# Patient Record
Sex: Male | Born: 1960 | Race: White | Hispanic: No | Marital: Married | Smoking: Never smoker
Health system: Southern US, Community
[De-identification: ages and names within clinical notes are randomized; demographics above are authoritative.]

## PROBLEM LIST (undated history)

## (undated) DIAGNOSIS — T7840XA Allergy, unspecified, initial encounter: Secondary | ICD-10-CM

## (undated) DIAGNOSIS — K219 Gastro-esophageal reflux disease without esophagitis: Secondary | ICD-10-CM

## (undated) DIAGNOSIS — M1990 Unspecified osteoarthritis, unspecified site: Secondary | ICD-10-CM

## (undated) DIAGNOSIS — N200 Calculus of kidney: Secondary | ICD-10-CM

## (undated) HISTORY — DX: Allergy, unspecified, initial encounter: T78.40XA

## (undated) HISTORY — DX: Calculus of kidney: N20.0

## (undated) HISTORY — DX: Unspecified osteoarthritis, unspecified site: M19.90

## (undated) HISTORY — PX: TONSILLECTOMY: SUR1361

## (undated) HISTORY — DX: Gastro-esophageal reflux disease without esophagitis: K21.9

---

## 1996-04-12 HISTORY — PX: LAMINOTOMY: SHX998

## 1997-04-12 HISTORY — PX: COLONOSCOPY: SHX174

## 2009-04-12 HISTORY — PX: UPPER GASTROINTESTINAL ENDOSCOPY: SHX188

## 2012-10-05 ENCOUNTER — Encounter: Payer: Self-pay | Admitting: *Deleted

## 2012-10-23 ENCOUNTER — Ambulatory Visit (INDEPENDENT_AMBULATORY_CARE_PROVIDER_SITE_OTHER): Payer: PRIVATE HEALTH INSURANCE | Admitting: General Surgery

## 2012-10-23 ENCOUNTER — Encounter: Payer: Self-pay | Admitting: General Surgery

## 2012-10-23 VITALS — BP 110/60 | HR 68 | Resp 12 | Ht 72.0 in | Wt 174.0 lb

## 2012-10-23 DIAGNOSIS — K4020 Bilateral inguinal hernia, without obstruction or gangrene, not specified as recurrent: Secondary | ICD-10-CM

## 2012-10-23 DIAGNOSIS — K4090 Unilateral inguinal hernia, without obstruction or gangrene, not specified as recurrent: Secondary | ICD-10-CM | POA: Insufficient documentation

## 2012-10-23 NOTE — Patient Instructions (Addendum)
Return if symptoms worsen or do not improve.

## 2012-10-23 NOTE — Progress Notes (Signed)
Patient ID: Andre Lara, male   DOB: Nov 30, 1960, 52 y.o.   MRN: [id number]  Chief Complaint  Patient presents with  . Other    evaluation of inguinal hernia    HPI Andre Lara is a 52 y.o. male here for evaluation of bilateral inguinal hernias. This occurred as a result of moving furniture about 1 month ago. The patient reports discomfort in the left groin area that has been ongoing. He states that he had some discomfort in the right groin for 1 week. He does report being able to feel an elongated structure on the left side. He states he is currently having a dull ache in the left groin. Patient has no previous history of hernias.  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 During the move he noticed left-sided groin discomfort as well as a thrombosed hemorrhoid, the latter which has significantly improved. The patient has had less groin discomfort over the last 7-10 days, but had been informed at the time of his rectal problem exam that hernias were present. The patient has not had any difficulty with constipation, nor has he appreciated any urinary difficulties. HPI  History reviewed. No pertinent past medical history.  Past Surgical History  Procedure Laterality Date  . Laminotomy      Issues with recovering from anesthesia  . Colonoscopy  1999  . Upper gastrointestinal endoscopy  2011    No family history on file.  Social History History  Substance Use Topics  . Smoking status: Never Smoker   . Smokeless tobacco: Never Used  . Alcohol Use: Yes    No Known Allergies  Current Outpatient Prescriptions  Medication Sig Dispense Refill  . cetirizine (ZYRTEC) 10 MG tablet Take 10 mg by mouth daily.       No current facility-administered medications for this visit.    Review of Systems Review of Systems  Constitutional: Negative.   Respiratory: Negative.   Cardiovascular: Negative.      Blood pressure 110/60, pulse 68, resp. rate 12, height 6' (1.829 m), weight 174 lb (78.926 kg).  Physical Exam Physical Exam  Constitutional: He is oriented to person, place, and time. He appears well-developed and well-nourished.  Eyes: Conjunctivae are normal. No scleral icterus.  Neck: Neck supple.  Cardiovascular: Normal rate, regular rhythm and normal heart sounds.   Pulmonary/Chest: Effort normal and breath sounds normal.  Abdominal: A hernia is present. Hernia confirmed positive in the right inguinal area and confirmed positive in the left inguinal area.  Lymphadenopathy:    He has no cervical adenopathy.  Neurological: He is alert and oriented to person, place, and time.    Data Reviewed No data for review.  Assessment    Small bilateral inguinal hernias, left modestly greater than right, minimally symptomatic.     Plan    Small hernias are evident, and is difficult from his clinical history to tell whether his groin pain was secondary to the acute development or hernias, or more likely that he became aware of them with muscle strain during the strenuous activity involved removing. He is noticed a significant improvement over the last week or so.  Should he continue to show improvement, he may continue physical activity as desired and was encouraged report should hernias become symptomatic. If he doesn't continue to improve, and appreciates ongoing discomfort in the groin areas, he's been encouraged to call for assessment and to discuss elective  hernia repair.        Andre Lara 10/23/2012, 9:15 PM

## 2012-11-08 ENCOUNTER — Ambulatory Visit: Payer: Self-pay | Admitting: Internal Medicine

## 2013-08-10 ENCOUNTER — Emergency Department (INDEPENDENT_AMBULATORY_CARE_PROVIDER_SITE_OTHER): Payer: PRIVATE HEALTH INSURANCE

## 2013-08-10 ENCOUNTER — Encounter (HOSPITAL_COMMUNITY): Payer: Self-pay | Admitting: Emergency Medicine

## 2013-08-10 ENCOUNTER — Emergency Department (HOSPITAL_COMMUNITY)
Admission: EM | Admit: 2013-08-10 | Discharge: 2013-08-10 | Disposition: A | Discharge status: Home / Self Care | Payer: PRIVATE HEALTH INSURANCE | Source: Home / Self Care | Attending: Family Medicine | Admitting: Family Medicine

## 2013-08-10 DIAGNOSIS — R509 Fever, unspecified: Secondary | ICD-10-CM

## 2013-08-10 MED ORDER — DOXYCYCLINE HYCLATE 100 MG PO CAPS: 100.0000 mg | ORAL_CAPSULE | Freq: Two times a day (BID) | ORAL | Status: DC

## 2013-08-10 MED ORDER — AMOXICILLIN 500 MG PO CAPS: 1000.0000 mg | ORAL_CAPSULE | Freq: Three times a day (TID) | ORAL | Status: DC

## 2013-08-10 NOTE — ED Notes (Signed)
Pt c/o cold sx onset 5 days Sx include cough, congested, BA, chills, palpitations Dr. Georgina Snell is in the room w/pt Alert w/no signs of acute distress.

## 2013-08-10 NOTE — Discharge Instructions (Signed)
Thank you for coming in today. I am not sure where the fever is coming from.  We are treating for both pneumonia and tick born illness.  Take the two antibiotics as directed.  Call or go to the emergency room if you get worse, have trouble breathing, have chest pains, or palpitations.   Fever, Adult A fever is a higher than normal body temperature. In an adult, an oral temperature around 98.6 F (37 C) is considered normal. A temperature of 100.4 F (38 C) or higher is generally considered a fever. Mild or moderate fevers generally have no long-term effects and often do not require treatment. Extreme fever (greater than or equal to 106 F or 41.1 C) can cause seizures. The sweating that may occur with repeated or prolonged fever may cause dehydration. Elderly people can develop confusion during a fever. A measured temperature can vary with:  Age.  Time of day.  Method of measurement (mouth, underarm, rectal, or ear). The fever is confirmed by taking a temperature with a thermometer. Temperatures can be taken different ways. Some methods are accurate and some are not.  An oral temperature is used most commonly. Electronic thermometers are fast and accurate.  An ear temperature will only be accurate if the thermometer is positioned as recommended by the manufacturer.  A rectal temperature is accurate and done for those adults who have a condition where an oral temperature cannot be taken.  An underarm (axillary) temperature is not accurate and not recommended. Fever is a symptom, not a disease.  CAUSES   Infections commonly cause fever.  Some noninfectious causes for fever include:  Some arthritis conditions.  Some thyroid or adrenal gland conditions.  Some immune system conditions.  Some types of cancer.  A medicine reaction.  High doses of certain street drugs such as methamphetamine.  Dehydration.  Exposure to high outside or room temperatures.  Occasionally, the  source of a fever cannot be determined. This is sometimes called a "fever of unknown origin" (FUO).  Some situations may lead to a temporary rise in body temperature that may go away on its own. Examples are:  Childbirth.  Surgery.  Intense exercise. HOME CARE INSTRUCTIONS   Take appropriate medicines for fever. Follow dosing instructions carefully. If you use acetaminophen to reduce the fever, be careful to avoid taking other medicines that also contain acetaminophen. Do not take aspirin for a fever if you are younger than age 53. There is an association with Reye's syndrome. Reye's syndrome is a rare but potentially deadly disease.  If an infection is present and antibiotics have been prescribed, take them as directed. Finish them even if you start to feel better.  Rest as needed.  Maintain an adequate fluid intake. To prevent dehydration during an illness with prolonged or recurrent fever, you may need to drink extra fluid.Drink enough fluids to keep your urine clear or pale yellow.  Sponging or bathing with room temperature water may help reduce body temperature. Do not use ice water or alcohol sponge baths.  Dress comfortably, but do not over-bundle. SEEK MEDICAL CARE IF:   You are unable to keep fluids down.  You develop vomiting or diarrhea.  You are not feeling at least partly better after 3 days.  You develop new symptoms or problems. SEEK IMMEDIATE MEDICAL CARE IF:   You have shortness of breath or trouble breathing.  You develop excessive weakness.  You are dizzy or you faint.  You are extremely thirsty or you are  making little or no urine.  You develop new pain that was not there before (such as in the head, neck, chest, back, or abdomen).  You have persistant vomiting and diarrhea for more than 1 to 2 days.  You develop a stiff neck or your eyes become sensitive to light.  You develop a skin rash.  You have a fever or persistent symptoms for more than 2  to 3 days.  You have a fever and your symptoms suddenly get worse. MAKE SURE YOU:   Understand these instructions.  Will watch your condition.  Will get help right away if you are not doing well or get worse. Document Released: 09/22/2000 Document Revised: 06/21/2011 Document Reviewed: 01/28/2011 [facility] Patient Information 2014 [place], [place].

## 2013-08-10 NOTE — ED Provider Notes (Signed)
Andre Lara is a 53 y.o. male who presents to Urgent Care today for fever body aches chills coughing congestion. Patient has had mild symptoms for 5 days and worsening symptoms 2 days ago. He notes significant chills and records occurred 2 days ago. He denies any chest pains or shortness of breath. He notes a moderately productive cough. He's tried over-the-counter oral antihistamines which have helped some.    History reviewed. No pertinent past medical history. History  Substance Use Topics  . Smoking status: Never Smoker   . Smokeless tobacco: Never Used  . Alcohol Use: Yes   ROS as above Medications: No current facility-administered medications for this encounter.   Current Outpatient Prescriptions  Medication Sig Dispense Refill  . amoxicillin (AMOXIL) 500 MG capsule Take 2 capsules (1,000 mg total) by mouth 3 (three) times daily.  42 capsule  0  . cetirizine (ZYRTEC) 10 MG tablet Take 10 mg by mouth daily.      Marland Kitchen doxycycline (VIBRAMYCIN) 100 MG capsule Take 1 capsule (100 mg total) by mouth 2 (two) times daily.  14 capsule  0    Exam:  BP 105/57  Pulse 80  Temp(Src) 102.3 F (39.1 C) (Oral)  Resp 18  SpO2 99% Gen: Well NAD HEENT: EOMI,  MMM normal tympanic membranes and posterior pharynx. Mildly inflamed nasal turbinates. Lungs: Normal work of breathing. CTABL Heart: RRR no MRG Abd: NABS, Soft. NT, ND Exts: Brisk capillary refill, warm and well perfused.  Skin: No significant rash   No results found for this or any previous visit (from the past 24 hour(s)). Dg Chest 2 View  08/10/2013   CLINICAL DATA:  Chills fever cough flu like symptoms  EXAM: CHEST  2 VIEW  COMPARISON:  None.  FINDINGS: The heart size and mediastinal contours are within normal limits. Both lungs are clear. The visualized skeletal structures are unremarkable.  IMPRESSION: No active cardiopulmonary disease.   Electronically Signed   By: Skipper Cliche M.D.   On: 08/10/2013 10:54    Assessment and  Plan: 53 y.o. male with fever. Unclear etiology. Likely pneumonia versus tick bite. Influenza is also a possibility. Plan to treat with high-dose amoxicillin and doxycycline for coverage of strep pneumo, atypical pneumonia, and tickborne illness. Tylenol and ibuprofen for symptom management. Will followup with primary care provider.  Discussed warning signs or symptoms. Please see discharge instructions. Patient expresses understanding.    Gregor Hams, MD 08/10/13 1210

## 2013-09-10 ENCOUNTER — Ambulatory Visit (INDEPENDENT_AMBULATORY_CARE_PROVIDER_SITE_OTHER): Payer: PRIVATE HEALTH INSURANCE | Admitting: Internal Medicine

## 2013-09-10 DIAGNOSIS — Z7184 Encounter for health counseling related to travel: Secondary | ICD-10-CM | POA: Insufficient documentation

## 2013-09-10 DIAGNOSIS — Z23 Encounter for immunization: Secondary | ICD-10-CM

## 2013-09-10 DIAGNOSIS — Z7189 Other specified counseling: Secondary | ICD-10-CM

## 2013-09-10 MED ORDER — CIPROFLOXACIN HCL 500 MG PO TABS: 500.0000 mg | ORAL_TABLET | Freq: Two times a day (BID) | ORAL | Status: DC

## 2013-09-10 MED ORDER — CHLOROQUINE PHOSPHATE 500 MG PO TABS
500.0000 mg | ORAL_TABLET | ORAL | Status: DC
Start: 2013-09-10 — End: 2013-11-29

## 2013-09-10 MED ORDER — TYPHOID VACCINE PO CPDR: 1.0000 | DELAYED_RELEASE_CAPSULE | ORAL | Status: DC

## 2013-09-10 NOTE — Progress Notes (Signed)
  Subjective:    JAMESON TORMEY is a 53 y.o. male who presents to the Infectious Disease clinic for travel consultation. Planned departure date: July 10th          Planned return date: 10 days Countries of travel: [place] Areas in country: rural   Accommodations: rural Purpose of travel: [place] [employer] work Prior travel out of [place]: yes Currently ill / Fever: no History of liver or kidney disease: no  Data Review:  no medical issues   Review of Systems n/a    Objective:    n/a    Assessment:    No contraindications to travel. none      Plan:    Issues discussed: environmental concerns, future shots, insect-borne illnesses, malaria, motion sickness, MVA safety, rabies, safe food/water, traveler's diarrhea, website/handouts for more information, what to do if ill upon return and what to do if ill while there. Immunizations recommended: Td. Malaria prophylaxis: chloroquine, weekly dose starting 1 week before entering endemic area, ending 4 weeks after leaving area Traveler's diarrhea prophylaxis: ciprofloxacin. Total duration of visit: 1 Hour. Total time spent on education, counseling, coordination of care: 30 Minutes.

## 2013-10-17 ENCOUNTER — Encounter: Payer: Self-pay | Admitting: Internal Medicine

## 2013-10-17 ENCOUNTER — Ambulatory Visit (INDEPENDENT_AMBULATORY_CARE_PROVIDER_SITE_OTHER): Payer: PRIVATE HEALTH INSURANCE | Admitting: Internal Medicine

## 2013-10-17 VITALS — BP 97/59 | HR 66 | Temp 97.8°F | Ht 73.1 in | Wt 174.0 lb

## 2013-10-17 DIAGNOSIS — K4090 Unilateral inguinal hernia, without obstruction or gangrene, not specified as recurrent: Secondary | ICD-10-CM

## 2013-10-17 DIAGNOSIS — Z0000 Encounter for general adult medical examination without abnormal findings: Secondary | ICD-10-CM

## 2013-10-17 NOTE — Assessment & Plan Note (Addendum)
Td 2015 [place] for diarrhea, no polyps; + FH  colon polyps, recommend a colonoscopy. Labs Labs including a A1c (patient reports he is intolerant to high sugar diet and likes to be checked for diabetes) Diet-exercise discussed

## 2013-10-17 NOTE — Progress Notes (Signed)
Pre visit review using our clinic review tool, if applicable. No additional management support is needed unless otherwise documented below in the visit note. 

## 2013-10-17 NOTE — Assessment & Plan Note (Signed)
Still has occasional discomfort on the left, exam confirms the presence off a left inguinal hernia, exam negative on the right. Plan: Recommend to see surgery

## 2013-10-17 NOTE — Patient Instructions (Signed)
Get your blood work before you leave   Next visit is for a physical exam in 1 year, mfasting Please make an appointment

## 2013-10-17 NOTE — Progress Notes (Signed)
Subjective:    Patient ID: UNKNOWN SCHLEYER, male    DOB: [birth year], 53 y.o.   MRN: [id number]  DOS:  10/17/2013 Type of visit - description: new pt, CPX History: In general feeling well, reports he has a long history of intolerance to high doses of sugar b/c  sugar goes  high and then " I crash". Currently eating healthy and having no issues   ROS No  CP, SOB  Denies  nausea, vomiting diarrhea, blood in the stools (-) cough, sputum production (-) wheezing, chest congestion No dysuria, gross hematuria, difficulty urinating  No testicular pain-swelling  No anxiety, depression Denies fatigue      History reviewed. No pertinent past medical history.  Past Surgical History  Procedure Laterality Date  . Laminotomy  1998    Issues with recovering from anesthesia  . Colonoscopy  1999    normal (done for diarrhea)  . Upper gastrointestinal endoscopy  2011    History   Social History  . Marital Status: Married    Spouse Name: N/A    Number of Children: 0  . Years of Education: N/A   Occupational History  . minister     Social History Main Topics  . Smoking status: Never Smoker   . Smokeless tobacco: Never Used  . Alcohol Use: No     Comment: very rare   . Drug Use: No  . Sexual Activity: Not on file   Other Topics Concern  . Not on file   Social History Narrative   Moved to [place] , from [place]   Family History  Problem Relation Age of Onset  . Hemochromatosis Mother   . Liver cancer Mother   . Diabetes Neg Hx   . CAD Neg Hx   . Colon cancer Other     GF  . Prostate cancer Father     dx at age ~ 86  . Colon polyps Father         Medication List       This list is accurate as of: 10/17/13 11:59 PM.  Always use your most recent med list.               cetirizine 10 MG tablet  Commonly known as:  ZYRTEC  Take 10 mg by mouth daily.     chloroquine 500 MG tablet  Commonly known as:  ARALEN  Take 1 tablet (500 mg total) by mouth once a  week. Begin 2 weeks prior to travel and complete 4 weeks after travel.     typhoid DR capsule  Commonly known as:  VIVOTIF  Take 1 capsule by mouth every other day. Keep refrigerated           Objective:   Physical Exam  Genitourinary:      BP 97/59  Pulse 66  Temp(Src) 97.8 F (36.6 C)  Ht 6' 1.1" (1.857 m)  Wt 174 lb (78.926 kg)  BMI 22.89 kg/m2  SpO2 98%  General -- alert, well-developed, NAD.  Neck --no thyromegaly , normal carotid pulse  HEENT-- Not pale.  Lungs -- normal respiratory effort, no intercostal retractions, no accessory muscle use, and normal breath sounds.  Heart-- normal rate, regular rhythm, no murmur.  Abdomen-- Not distended, good bowel sounds, No rebound or rigidity. No mass,organomegaly. Rectal-- few ext hemorrhoids Normal sphincter tone. No rectal masses or tenderness. No stool found  Prostate--Prostate gland firm and smooth, no enlargement, nodularity, tenderness, mass, asymmetry or induration. Extremities--  no pretibial edema bilaterally  Neurologic--  alert & oriented X3. Speech normal, gait appropriate for age, strength symmetric and appropriate for age.  Psych-- Cognition and judgment appear intact. Cooperative with normal attention span and concentration. No anxious or depressed appearing.      Assessment & Plan:

## 2013-10-18 LAB — LIPID PANEL
CHOLESTEROL: 193 mg/dL (ref 0–200)
HDL: 86 mg/dL (ref >39.00)
LDL CALC: 98 mg/dL (ref 0–99)
NonHDL: 107
TRIGLYCERIDES: 46 mg/dL (ref 0.0–149.0)
Total CHOL/HDL Ratio: 2
VLDL: 9.2 mg/dL (ref 0.0–40.0)

## 2013-10-18 LAB — CBC WITH DIFFERENTIAL/PLATELET
BASOS ABS: 0 10*3/uL (ref 0.0–0.1)
Basophils Relative: 0.4 % (ref 0.0–3.0)
EOS ABS: 0.1 10*3/uL (ref 0.0–0.7)
Eosinophils Relative: 1.9 % (ref 0.0–5.0)
HCT: 43.5 % (ref 39.0–52.0)
Hemoglobin: 14.8 g/dL (ref 13.0–17.0)
LYMPHS PCT: 33.3 % (ref 12.0–46.0)
Lymphs Abs: 1.6 10*3/uL (ref 0.7–4.0)
MCHC: 34 g/dL (ref 30.0–36.0)
MCV: 91.7 fl (ref 78.0–100.0)
Monocytes Absolute: 0.5 10*3/uL (ref 0.1–1.0)
Monocytes Relative: 11.1 % (ref 3.0–12.0)
NEUTROS PCT: 53.3 % (ref 43.0–77.0)
Neutro Abs: 2.5 10*3/uL (ref 1.4–7.7)
Platelets: 261 10*3/uL (ref 150.0–400.0)
RBC: 4.74 Mil/uL (ref 4.22–5.81)
RDW: 13.5 % (ref 11.5–15.5)
WBC: 4.8 10*3/uL (ref 4.0–10.5)

## 2013-10-18 LAB — HEMOGLOBIN A1C: Hgb A1c MFr Bld: 5.2 % (ref 4.6–6.5)

## 2013-10-18 LAB — COMPREHENSIVE METABOLIC PANEL
ALBUMIN: 4.5 g/dL (ref 3.5–5.2)
ALT: 23 U/L (ref 0–53)
AST: 34 U/L (ref 0–37)
Alkaline Phosphatase: 53 U/L (ref 39–117)
BUN: 15 mg/dL (ref 6–23)
CALCIUM: 9.7 mg/dL (ref 8.4–10.5)
CHLORIDE: 104 meq/L (ref 96–112)
CO2: 23 meq/L (ref 19–32)
Creatinine, Ser: 1.1 mg/dL (ref 0.4–1.5)
GFR: 74.29 mL/min (ref >60.00)
Glucose, Bld: 88 mg/dL (ref 70–99)
POTASSIUM: 4.3 meq/L (ref 3.5–5.1)
Sodium: 139 mEq/L (ref 135–145)
Total Bilirubin: 1.2 mg/dL (ref 0.2–1.2)
Total Protein: 6.4 g/dL (ref 6.0–8.3)

## 2013-10-18 LAB — TSH: TSH: 1.1 u[IU]/mL (ref 0.35–4.50)

## 2013-10-18 LAB — PSA: PSA: 0.83 ng/mL (ref 0.10–4.00)

## 2013-10-19 ENCOUNTER — Encounter: Payer: Self-pay | Admitting: *Deleted

## 2013-11-29 ENCOUNTER — Encounter: Payer: Self-pay | Admitting: General Surgery

## 2013-11-29 ENCOUNTER — Ambulatory Visit (INDEPENDENT_AMBULATORY_CARE_PROVIDER_SITE_OTHER): Payer: PRIVATE HEALTH INSURANCE | Admitting: General Surgery

## 2013-11-29 VITALS — BP 110/66 | HR 72 | Resp 12 | Ht 72.0 in | Wt 171.0 lb

## 2013-11-29 DIAGNOSIS — K4030 Unilateral inguinal hernia, with obstruction, without gangrene, not specified as recurrent: Secondary | ICD-10-CM

## 2013-11-29 DIAGNOSIS — K4000 Bilateral inguinal hernia, with obstruction, without gangrene, not specified as recurrent: Secondary | ICD-10-CM

## 2013-11-29 NOTE — Patient Instructions (Addendum)
Inguinal Hernia, Adult Muscles help keep everything in the body in its proper place. But if a weak spot in the muscles develops, something can poke through. That is called a hernia. When this happens in the lower part of the belly (abdomen), it is called an inguinal hernia. (It takes its name from a part of the body in this region called the inguinal canal.) A weak spot in the wall of muscles lets some fat or part of the small intestine bulge through. An inguinal hernia can develop at any age. Men get them more often than women. CAUSES  In adults, an inguinal hernia develops over time.  It can be triggered by:  Suddenly straining the muscles of the lower abdomen.  Lifting heavy objects.  Straining to have a bowel movement. Difficult bowel movements (constipation) can lead to this.  Constant coughing. This may be caused by smoking or lung disease.  Being overweight.  Being pregnant.  Working at a job that requires long periods of standing or heavy lifting.  Having had an inguinal hernia before. One type can be an emergency situation. It is called a strangulated inguinal hernia. It develops if part of the small intestine slips through the weak spot and cannot get back into the abdomen. The blood supply can be cut off. If that happens, part of the intestine may die. This situation requires emergency surgery. SYMPTOMS  Often, a small inguinal hernia has no symptoms. It is found when a healthcare provider does a physical exam. Larger hernias usually have symptoms.   In adults, symptoms may include:  A lump in the groin. This is easier to see when the person is standing. It might disappear when lying down.  In men, a lump in the scrotum.  Pain or burning in the groin. This occurs especially when lifting, straining or coughing.  A dull ache or feeling of pressure in the groin.  Signs of a strangulated hernia can include:  A bulge in the groin that becomes very painful and tender to the  touch.  A bulge that turns red or purple.  Fever, nausea and vomiting.  Inability to have a bowel movement or to pass gas. DIAGNOSIS  To decide if you have an inguinal hernia, a healthcare provider will probably do a physical examination.  This will include asking questions about any symptoms you have noticed.  The healthcare provider might feel the groin area and ask you to cough. If an inguinal hernia is felt, the healthcare provider may try to slide it back into the abdomen.  Usually no other tests are needed. TREATMENT  Treatments can vary. The size of the hernia makes a difference. Options include:  Watchful waiting. This is often suggested if the hernia is small and you have had no symptoms.  No medical procedure will be done unless symptoms develop.  You will need to watch closely for symptoms. If any occur, contact your healthcare provider right away.  Surgery. This is used if the hernia is larger or you have symptoms.  Open surgery. This is usually an outpatient procedure (you will not stay overnight in a hospital). An cut (incision) is made through the skin in the groin. The hernia is put back inside the abdomen. The weak area in the muscles is then repaired by herniorrhaphy or hernioplasty. Herniorrhaphy: in this type of surgery, the weak muscles are sewn back together. Hernioplasty: a patch or mesh is used to close the weak area in the abdominal wall.  Laparoscopy.   In this procedure, a surgeon makes small incisions. A thin tube with a tiny video camera (called a laparoscope) is put into the abdomen. The surgeon repairs the hernia with mesh by looking with the video camera and using two long instruments. HOME CARE INSTRUCTIONS   After surgery to repair an inguinal hernia:  You will need to take pain medicine prescribed by your healthcare provider. Follow all directions carefully.  You will need to take care of the wound from the incision.  Your activity will be  restricted for awhile. This will probably include no heavy lifting for several weeks. You also should not do anything too active for a few weeks. When you can return to work will depend on the type of job that you have.  During "watchful waiting" periods, you should:  Maintain a healthy weight.  Eat a diet high in fiber (fruits, vegetables and whole grains).  Drink plenty of fluids to avoid constipation. This means drinking enough water and other liquids to keep your urine clear or pale yellow.  Do not lift heavy objects.  Do not stand for long periods of time.  Quit smoking. This should keep you from developing a frequent cough. SEEK MEDICAL CARE IF:   A bulge develops in your groin area.  You feel pain, a burning sensation or pressure in the groin. This might be worse if you are lifting or straining.  You develop a fever of more than 100.5 F (38.1 C). SEEK IMMEDIATE MEDICAL CARE IF:   Pain in the groin increases suddenly.  A bulge in the groin gets bigger suddenly and does not go down.  For men, there is sudden pain in the scrotum. Or, the size of the scrotum increases.  A bulge in the groin area becomes red or purple and is painful to touch.  You have nausea or vomiting that does not go away.  You feel your heart beating much faster than normal.  You cannot have a bowel movement or pass gas.  You develop a fever of more than 102.0 F (38.9 C). Document Released: 08/15/2008 Document Revised: 06/21/2011 Document Reviewed: 08/15/2008 [facility] Patient Information 2015 [place], [place]. This information is not intended to replace advice given to you by your health care provider. Make sure you discuss any questions you have with your health care provider. Colonoscopy A colonoscopy is an exam to look at the entire large intestine (colon). This exam can help find problems such as tumors, polyps, inflammation, and areas of bleeding. The exam takes about 1 hour.  LET [facility]  CARE PROVIDER KNOW ABOUT:   Any allergies you have.  All medicines you are taking, including vitamins, herbs, eye drops, creams, and over-the-counter medicines.  Previous problems you or members of your family have had with the use of anesthetics.  Any blood disorders you have.  Previous surgeries you have had.  Medical conditions you have. RISKS AND COMPLICATIONS  Generally, this is a safe procedure. However, as with any procedure, complications can occur. Possible complications include:  Bleeding.  Tearing or rupture of the colon wall.  Reaction to medicines given during the exam.  Infection (rare). BEFORE THE PROCEDURE   Ask your health care provider about changing or stopping your regular medicines.  You may be prescribed an oral bowel prep. This involves drinking a large amount of medicated liquid, starting the day before your procedure. The liquid will cause you to have multiple loose stools until your stool is almost clear or light green.  This cleans out your colon in preparation for the procedure.  Do not eat or drink anything else once you have started the bowel prep, unless your health care provider tells you it is safe to do so.  Arrange for someone to drive you home after the procedure. PROCEDURE   You will be given medicine to help you relax (sedative).  You will lie on your side with your knees bent.  A long, flexible tube with a light and camera on the end (colonoscope) will be inserted through the rectum and into the colon. The camera sends video back to a computer screen as it moves through the colon. The colonoscope also releases carbon dioxide gas to inflate the colon. This helps your health care provider see the area better.  During the exam, your health care provider may take a small tissue sample (biopsy) to be examined under a microscope if any abnormalities are found.  The exam is finished when the entire colon has been viewed. AFTER THE PROCEDURE     Do not drive for 24 hours after the exam.  You may have a small amount of blood in your stool.  You may pass moderate amounts of gas and have mild abdominal cramping or bloating. This is caused by the gas used to inflate your colon during the exam.  Ask when your test results will be ready and how you will get your results. Make sure you get your test results. Document Released: 03/26/2000 Document Revised: 01/17/2013 Document Reviewed: 12/04/2012 [facility] Patient Information 2015 [place], [place]. This information is not intended to replace advice given to you by your health care provider. Make sure you discuss any questions you have with your health care provider.  Patient's surgery has been scheduled for 12-24-13 at [facility].   This patient wishes to hold off on colonoscopy at this time. He will contact the office if he wishes to proceed.

## 2013-11-29 NOTE — Progress Notes (Signed)
Patient ID: Andre Lara, male   DOB: 12-15-1960, 53 y.o.   MRN: [id number]  Chief Complaint  Patient presents with  . Other    left inguinal hernia     HPI Andre Lara is a 53 y.o. male here for evaluation of an increasingly symptomatic left inguinal hernia. This first came to his attention as a result of moving furniture about 1 year ago. The patient reports discomfort left groin area.Marland KitchenHe is currently having a dull ache in the left groin. He states the left inguinal area is popping in and out. The right inguinal area he reports no pain. When evaluated in July 2014 bilateral inguinal hernias were identified.   HPI  No past medical history on file.  Past Surgical History  Procedure Laterality Date  . Laminotomy  1998    Issues with recovering from anesthesia  . Colonoscopy  1999    normal (done for diarrhea)  . Upper gastrointestinal endoscopy  2011    Family History  Problem Relation Age of Onset  . Hemochromatosis Mother   . Liver cancer Mother   . Diabetes Neg Hx   . CAD Neg Hx   . Colon cancer Other     GF  . Prostate cancer Father     dx at age ~ 66  . Colon polyps Father     Social History History  Substance Use Topics  . Smoking status: Never Smoker   . Smokeless tobacco: Never Used  . Alcohol Use: No     Comment: very rare     No Known Allergies  Current Outpatient Prescriptions  Medication Sig Dispense Refill  . aspirin 325 MG tablet Take 325 mg by mouth as needed.      . Multiple Vitamin (MULTI VITAMIN DAILY PO) Take 1 tablet by mouth daily.      . cetirizine (ZYRTEC) 10 MG tablet Take 10 mg by mouth daily.       No current facility-administered medications for this visit.    Review of Systems Review of Systems  Constitutional: Negative.   Respiratory: Negative.   Cardiovascular: Negative.     Blood pressure 110/66, pulse 72, resp. rate 12, height 6' (1.829 m), weight 171 lb (77.565 kg).  Physical Exam Physical Exam  Constitutional: He  is oriented to person, place, and time. He appears well-developed and well-nourished.  Eyes: Conjunctivae are normal. No scleral icterus.  Neck: Neck supple.  Cardiovascular: Normal rate, regular rhythm and normal heart sounds.   Pulmonary/Chest: Breath sounds normal.  Abdominal: Soft. Normal appearance and bowel sounds are normal. There is no tenderness. A hernia is present. Hernia confirmed positive in the right inguinal area ( reducible ) and confirmed positive in the left inguinal area ( reducible ).  Genitourinary: Testes normal.  Neurological: He is alert and oriented to person, place, and time.  Skin: Skin is warm and dry.   Discussed colonoscopy after hernia surgery.    Assessment    Increasingly symptomatic left inguinal hernia. Moderate size right inguinal hernia.     Plan    Indication for elective repair was reviewed. Considering the size of the right inguinal hernia, repair at the same time as the left would be most appropriate. The possibility of preprocedure colonoscopy was discussed. He is of age for screening exam. At this time, he elected to defer and he will discuss it further with his PCP.  Patient's surgery has been scheduled for 12-24-13 at [facility].  PCP: Dr. Princella Pellegrini, Andre Lara 11/30/2013, 6:16 PM

## 2013-11-30 ENCOUNTER — Other Ambulatory Visit: Payer: Self-pay | Admitting: General Surgery

## 2013-11-30 DIAGNOSIS — K4000 Bilateral inguinal hernia, with obstruction, without gangrene, not specified as recurrent: Secondary | ICD-10-CM

## 2013-11-30 DIAGNOSIS — K469 Unspecified abdominal hernia without obstruction or gangrene: Secondary | ICD-10-CM | POA: Insufficient documentation

## 2013-12-19 ENCOUNTER — Telehealth: Payer: Self-pay

## 2013-12-19 NOTE — Telephone Encounter (Signed)
Patient called to say that his wife was in a car accident and had some injuries that will require him to stay home with her for a while. He is canceling his surgery scheduled for 12/24/13 for now. He will call back to reschedule this once he is able. He is aware that he may need a short pre op visit here prior to his surgery. OR has been notified to cancel his surgery.

## 2014-06-24 ENCOUNTER — Ambulatory Visit (HOSPITAL_BASED_OUTPATIENT_CLINIC_OR_DEPARTMENT_OTHER)
Admission: RE | Admit: 2014-06-24 | Discharge: 2014-06-24 | Disposition: A | Discharge status: Home / Self Care | Payer: PRIVATE HEALTH INSURANCE | Source: Ambulatory Visit | Attending: Internal Medicine | Admitting: Internal Medicine

## 2014-06-24 ENCOUNTER — Encounter: Payer: Self-pay | Admitting: Internal Medicine

## 2014-06-24 ENCOUNTER — Ambulatory Visit (INDEPENDENT_AMBULATORY_CARE_PROVIDER_SITE_OTHER): Payer: PRIVATE HEALTH INSURANCE | Admitting: Internal Medicine

## 2014-06-24 VITALS — BP 118/74 | HR 60 | Temp 98.1°F | Ht 72.0 in | Wt 180.2 lb

## 2014-06-24 DIAGNOSIS — M545 Low back pain: Secondary | ICD-10-CM | POA: Diagnosis present

## 2014-06-24 DIAGNOSIS — M47897 Other spondylosis, lumbosacral region: Secondary | ICD-10-CM | POA: Insufficient documentation

## 2014-06-24 DIAGNOSIS — M549 Dorsalgia, unspecified: Secondary | ICD-10-CM

## 2014-06-24 DIAGNOSIS — K4090 Unilateral inguinal hernia, without obstruction or gangrene, not specified as recurrent: Secondary | ICD-10-CM

## 2014-06-24 DIAGNOSIS — Z0000 Encounter for general adult medical examination without abnormal findings: Secondary | ICD-10-CM

## 2014-06-24 LAB — URINALYSIS, ROUTINE W REFLEX MICROSCOPIC
BILIRUBIN URINE: NEGATIVE
Hgb urine dipstick: NEGATIVE
KETONES UR: NEGATIVE
Leukocytes, UA: NEGATIVE
Nitrite: NEGATIVE
RBC / HPF: NONE SEEN (ref 0–?)
Specific Gravity, Urine: 1.015 (ref 1.000–1.030)
Total Protein, Urine: NEGATIVE
URINE GLUCOSE: NEGATIVE
UROBILINOGEN UA: 0.2 (ref 0.0–1.0)
WBC UA: NONE SEEN (ref 0–?)
pH: 6 (ref 5.0–8.0)

## 2014-06-24 MED ORDER — MELOXICAM 7.5 MG PO TABS: 7.5000 mg | ORAL_TABLET | Freq: Every day | ORAL | Status: AC | PRN

## 2014-06-24 NOTE — Assessment & Plan Note (Signed)
Request a GI referral for a colonoscopy

## 2014-06-24 NOTE — Patient Instructions (Signed)
Go to the lab for a urine sample  Stop by the first floor and get the XR   Take meloxicam 7.5 mg one tablet a day,   this is an anti-inflammatory medication, take it with food to prevent stomach irritation.  Call if you're not improving in the next 3 or 4 weeks  We are referring to physical therapy

## 2014-06-24 NOTE — Progress Notes (Signed)
   Subjective:    Patient ID: Andre Lara, male    DOB: Sep 05, 1960, 54 y.o.   MRN: [id number]  DOS:  06/24/2014 Type of visit - description : acute Interval history: History of back surgery many years ago, he has always been diligent doing mild exercising and stretching, in the last 10 months has developed mild pain with occasional exacerbation up to a level of 6/10, described it as a click or pressure in the area. The pain was initially left-sided at the lower thoracic and lumbar spine, now is more on the right side. No radiation to the buttocks, denies bladder or bowel incontinence, no lower extremity paresthesias Pain is worse when he lies flat on the floor, okay when he twists his torso.  Review of Systems Denies fever chills No recent fall or injury No nausea, vomiting, diarrhea No dysuria or gross hematuria.  Past Medical History  Diagnosis Date  . Kidney stones     Past Surgical History  Procedure Laterality Date  . Laminotomy  1998    Issues with recovering from anesthesia  . Colonoscopy  1999    normal (done for diarrhea)  . Upper gastrointestinal endoscopy  2011    History   Social History  . Marital Status: Married    Spouse Name: N/A  . Number of Children: 0  . Years of Education: N/A   Occupational History  . minister     Social History Main Topics  . Smoking status: Never Smoker   . Smokeless tobacco: Never Used  . Alcohol Use: No     Comment: very rare   . Drug Use: No  . Sexual Activity: Not on file   Other Topics Concern  . Not on file   Social History Narrative   Moved to [place] 2014 , from [place]        Medication List       This list is accurate as of: 06/24/14  5:55 PM.  Always use your most recent med list.               aspirin 325 MG tablet  Take 325 mg by mouth as needed.     cetirizine 10 MG tablet  Commonly known as:  ZYRTEC  Take 10 mg by mouth daily.     meloxicam 7.5 MG tablet  Commonly known as:  MOBIC    Take 1 tablet (7.5 mg total) by mouth daily as needed for pain.     MULTI VITAMIN DAILY PO  Take 1 tablet by mouth daily.           Objective:   Physical Exam BP 118/74 mmHg  Pulse 60  Temp(Src) 98.1 F (36.7 C) (Oral)  Ht 6' (1.829 m)  Wt 180 lb 4 oz (81.761 kg)  BMI 24.44 kg/m2  SpO2 98% General:   Well developed, well nourished . NAD.  HEENT:  Normocephalic . Face symmetric, atraumatic  Abdomen : not distended, nontender Muscle skeletal: no pretibial edema bilaterally  No TTP at the cervical, thoracic, lumbar spine or the SI joints Skin: Not pale. Not jaundice Neurologic:  alert & oriented X3.  Speech normal, gait appropriate for age and unassisted DTRs symmetric, straight leg test negative Psych--  Cognition and judgment appear intact.  Cooperative with normal attention span and concentration.  Behavior appropriate. No anxious or depressed appearing.       Assessment & Plan:

## 2014-06-24 NOTE — Progress Notes (Signed)
Pre visit review using our clinic review tool, if applicable. No additional management support is needed unless otherwise documented below in the visit note. 

## 2014-06-24 NOTE — Assessment & Plan Note (Signed)
Back pain as described in the history of present illness, no red flag symptoms but history of previous surgery. Plan: X-rays, Mobic, GI precautions discussed. Formal PT Call if not improving in the next few months

## 2014-06-24 NOTE — Assessment & Plan Note (Signed)
For now, decided to postpone the surgery, wife having health problems (had a fracture

## 2014-06-26 ENCOUNTER — Encounter: Payer: Self-pay | Admitting: Gastroenterology

## 2014-07-15 ENCOUNTER — Ambulatory Visit: Payer: PRIVATE HEALTH INSURANCE | Admitting: Physical Therapy

## 2014-07-15 ENCOUNTER — Ambulatory Visit (AMBULATORY_SURGERY_CENTER): Payer: Self-pay | Admitting: *Deleted

## 2014-07-15 VITALS — Ht 72.0 in | Wt 181.8 lb

## 2014-07-15 DIAGNOSIS — Z1211 Encounter for screening for malignant neoplasm of colon: Secondary | ICD-10-CM

## 2014-07-15 MED ORDER — NA SULFATE-K SULFATE-MG SULF 17.5-3.13-1.6 GM/177ML PO SOLN: 1.0000 | Freq: Once | ORAL | Status: DC

## 2014-07-15 NOTE — Progress Notes (Signed)
No egg or soy allergy No home 02 No diet pills Hard to wake with general anesthesia in the past , no other issues but he did wake up during colon in 1999, had sedation in 2011 with no issues  emmi video to e mail

## 2014-07-29 ENCOUNTER — Encounter: Payer: PRIVATE HEALTH INSURANCE | Admitting: Gastroenterology

## 2014-07-29 ENCOUNTER — Ambulatory Visit (AMBULATORY_SURGERY_CENTER): Payer: PRIVATE HEALTH INSURANCE | Admitting: Gastroenterology

## 2014-07-29 ENCOUNTER — Encounter: Payer: Self-pay | Admitting: Gastroenterology

## 2014-07-29 VITALS — BP 115/69 | HR 64 | Temp 98.4°F | Resp 16 | Ht 72.0 in | Wt 181.0 lb

## 2014-07-29 DIAGNOSIS — Z1211 Encounter for screening for malignant neoplasm of colon: Secondary | ICD-10-CM

## 2014-07-29 DIAGNOSIS — D125 Benign neoplasm of sigmoid colon: Secondary | ICD-10-CM

## 2014-07-29 MED ORDER — SODIUM CHLORIDE 0.9 % IV SOLN: 500.0000 mL | INTRAVENOUS | Status: DC

## 2014-07-29 NOTE — Progress Notes (Signed)
Called to room to assist during endoscopic procedure.  Patient ID and intended procedure confirmed with present staff. Received instructions for my participation in the procedure from the performing physician.  

## 2014-07-29 NOTE — Op Note (Signed)
[place]  [address] [place], [address]   COLONOSCOPY PROCEDURE REPORT  PATIENT: Andre Lara, Andre Lara  MR#: [id number] BIRTHDATE: April 09, 1961 , 48  yrs. old GENDER: male ENDOSCOPIST: Inda Castle, MD REFERRED MW:UXLK Larose Kells, M.D. PROCEDURE DATE:  07/29/2014 PROCEDURE:   Colonoscopy, screening and Colonoscopy with snare polypectomy First Screening Colonoscopy - Avg.  risk and is 50 yrs.  old or older - No.  Prior Negative Screening - Now for repeat screening. 10 or more years since last screening  History of Adenoma - Now for follow-up colonoscopy & has been > or = to 3 yrs.  N/A ASA CLASS:   Class II INDICATIONS:Colorectal Neoplasm Risk Assessment for this procedure is average risk. MEDICATIONS: Monitored anesthesia care and Propofol 230 mg IV  DESCRIPTION OF PROCEDURE:   After the risks benefits and alternatives of the procedure were thoroughly explained, informed consent was obtained.  The digital rectal exam revealed no abnormalities of the rectum.   The LB [id number]  endoscope was introduced through the anus and advanced to the ileum. No adverse events experienced.   The quality of the prep was (Suprep was used) excellent.  The instrument was then slowly withdrawn as the colon was fully examined.      COLON FINDINGS: A sessile polyp measuring 6 mm in size was found in the sigmoid colon.  A polypectomy was performed with a cold snare. The resection was complete, the polyp tissue was completely retrieved and sent to histology.   The examination was otherwise normal.  Retroflexed views revealed no abnormalities. The time to cecum = 3.4 Withdrawal time = 12.1   The scope was withdrawn and the procedure completed. COMPLICATIONS: There were no immediate complications.  ENDOSCOPIC IMPRESSION: 1.   Sessile polyp was found in the sigmoid colon; polypectomy was performed with a cold snare 2.   The examination was otherwise  normal  RECOMMENDATIONS: If the polyp(s) removed today are proven to be adenomatous (pre-cancerous) polyps, you will need a repeat colonoscopy in 5 years.  Otherwise you should continue to follow colorectal cancer screening guidelines for "routine risk" patients with colonoscopy in 10 years.  You will receive a letter within 1-2 weeks with the results of your biopsy as well as final recommendations.  Please call my office if you have not received a letter after 3 weeks.  eSigned:  Inda Castle, MD 07/29/2014 5:16 PM   cc:   PATIENT NAME:  Kerem, Gilmer MR#: [id number]

## 2014-07-29 NOTE — Progress Notes (Signed)
Patient to restroom prior to discharge.when questioned his pain level paitn responded "what is pain, I have a high tolerance."Patient stating he is ok, just gassy.reinstructed patient and wife regarding warm fluids, positioning and walking assists with passage of air once home.

## 2014-07-29 NOTE — Progress Notes (Signed)
Report to PACU, RN, vss, BBS= Clear.  

## 2014-07-29 NOTE — Patient Instructions (Signed)
YOU HAD AN ENDOSCOPIC PROCEDURE TODAY AT THE [facility]:   Refer to the procedure report that was given to you for any specific questions about what was found during the examination.  If the procedure report does not answer your questions, please call your gastroenterologist to clarify.  If you requested that your care partner not be given the details of your procedure findings, then the procedure report has been included in a sealed envelope for you to review at your convenience later.  YOU SHOULD EXPECT: Some feelings of bloating in the abdomen. Passage of more gas than usual.  Walking can help get rid of the air that was put into your GI tract during the procedure and reduce the bloating. If you had a lower endoscopy (such as a colonoscopy or flexible sigmoidoscopy) you may notice spotting of blood in your stool or on the toilet paper. If you underwent a bowel prep for your procedure, you may not have a normal bowel movement for a few days.  Please Note:  You might notice some irritation and congestion in your nose or some drainage.  This is from the oxygen used during your procedure.  There is no need for concern and it should clear up in a day or so.  SYMPTOMS TO REPORT IMMEDIATELY:   Following lower endoscopy (colonoscopy or flexible sigmoidoscopy):  Excessive amounts of blood in the stool  Significant tenderness or worsening of abdominal pains  Swelling of the abdomen that is new, acute  Fever of 100F or higher   For urgent or emergent issues, a gastroenterologist can be reached at any hour by calling [phone].   DIET: Your first meal following the procedure should be a small meal and then it is ok to progress to your normal diet. Heavy or fried foods are harder to digest and may make you feel nauseous or bloated.  Likewise, meals heavy in dairy and vegetables can increase bloating.  Drink plenty of fluids but you should avoid alcoholic beverages for 24  hours.  ACTIVITY:  You should plan to take it easy for the rest of today and you should NOT DRIVE or use heavy machinery until tomorrow (because of the sedation medicines used during the test).    FOLLOW UP: Our staff will call the number listed on your records the next business day following your procedure to check on you and address any questions or concerns that you may have regarding the information given to you following your procedure. If we do not reach you, we will leave a message.  However, if you are feeling well and you are not experiencing any problems, there is no need to return our call.  We will assume that you have returned to your regular daily activities without incident.  If any biopsies were taken you will be contacted by phone or by letter within the next 1-3 weeks.  Please call us at [phone] if you have not heard about the biopsies in 3 weeks.    SIGNATURES/CONFIDENTIALITY: You and/or your care partner have signed paperwork which will be entered into your electronic medical record.  These signatures attest to the fact that that the information above on your After Visit Summary has been reviewed and is understood.  Full responsibility of the confidentiality of this discharge information lies with you and/or your care-partner. 

## 2014-07-30 ENCOUNTER — Telehealth: Payer: Self-pay | Admitting: *Deleted

## 2014-07-30 NOTE — Telephone Encounter (Signed)
  Follow up Call-  Call back number 07/29/2014  Post procedure Call Back phone  # [phone]  Permission to leave phone message Yes     Patient questions:  Do you have a fever, pain , or abdominal swelling? No. Pain Score  0 *  Have you tolerated food without any problems? Yes.    Have you been able to return to your normal activities? Yes.    Do you have any questions about your discharge instructions: Diet   No. Medications  No. Follow up visit  No.  Do you have questions or concerns about your Care? No.  Actions: * If pain score is 4 or above: No action needed, pain <4.

## 2014-08-06 ENCOUNTER — Encounter: Payer: Self-pay | Admitting: Gastroenterology

## 2015-08-08 ENCOUNTER — Telehealth: Payer: Self-pay

## 2015-08-08 ENCOUNTER — Other Ambulatory Visit: Payer: Self-pay | Admitting: Internal Medicine

## 2015-08-08 DIAGNOSIS — K4091 Unilateral inguinal hernia, without obstruction or gangrene, recurrent: Secondary | ICD-10-CM

## 2015-08-08 NOTE — Telephone Encounter (Signed)
Patient called stated he was told by provider to call when he was ready for surgery on his hernia patient is now ready and looking to be referred to a surgeon. Please advise [phone]

## 2015-08-08 NOTE — Telephone Encounter (Signed)
Enter a referral for general surgery, DX inguinal hernia. Also, he is due for a CPX, arrange if patient so desires.

## 2015-11-24 IMAGING — DX DG LUMBAR SPINE COMPLETE 4+V
5 series · 5 of 5 positions shown · non-contrast
Comparison: None.

CLINICAL DATA: Lumbago, chronic.  Radiation into right hip region.

EXAM:
LUMBAR SPINE - COMPLETE 4+ VIEW

[l-spine ap]
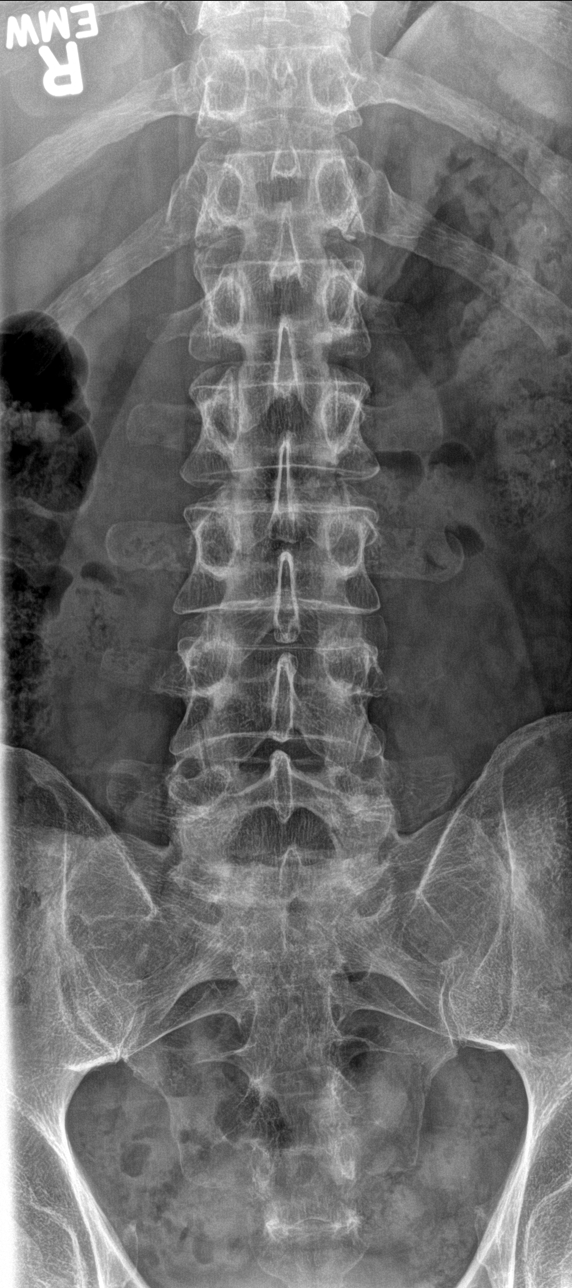

[l-spine obl (1 of 2)]
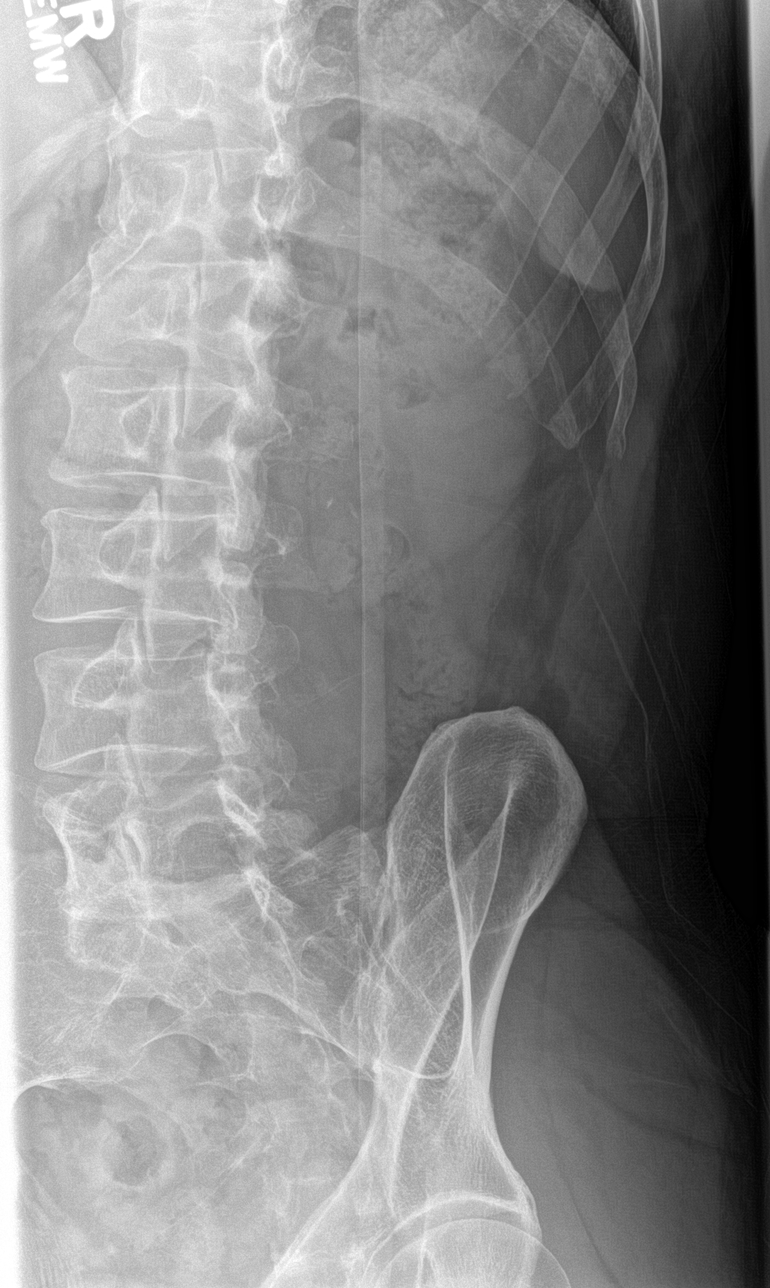

[l-spine obl (2 of 2)]
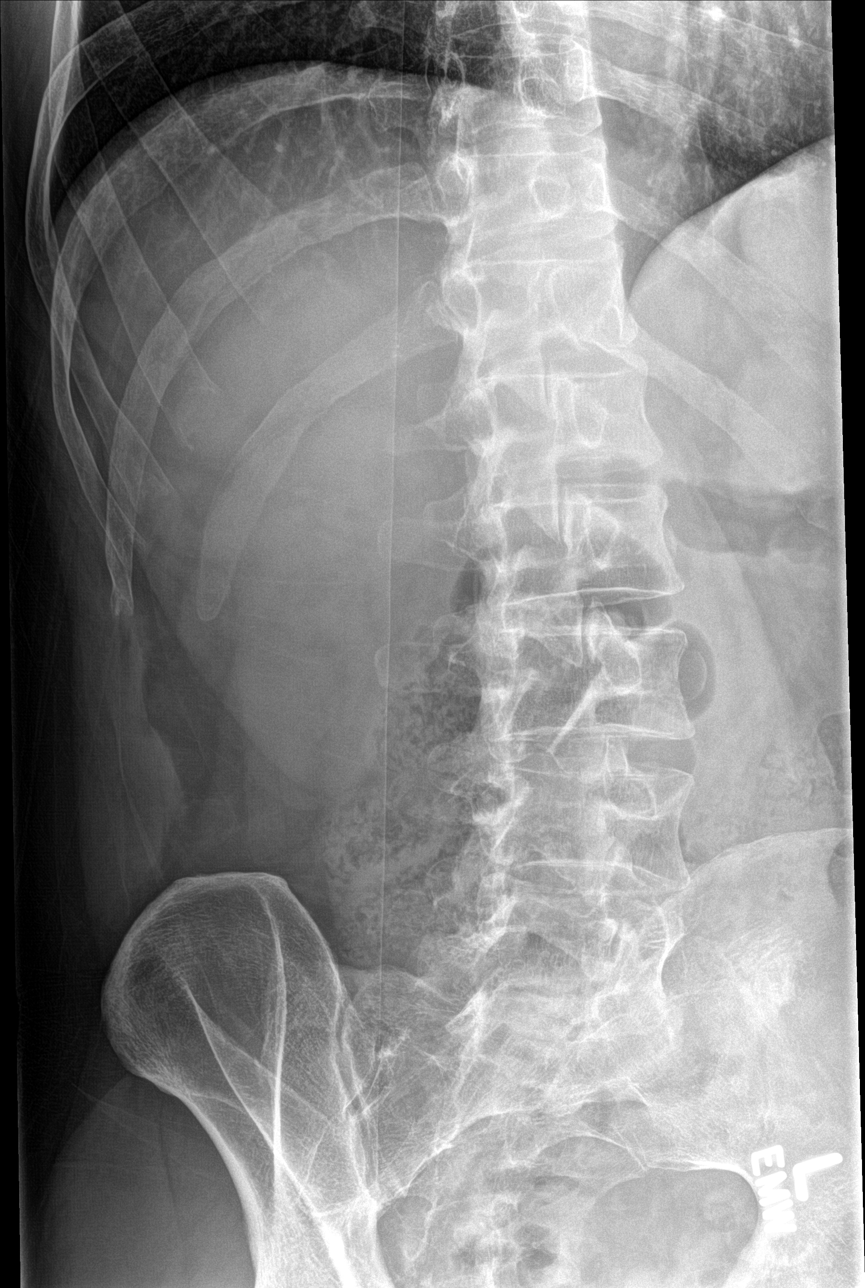

[l-spine lat]
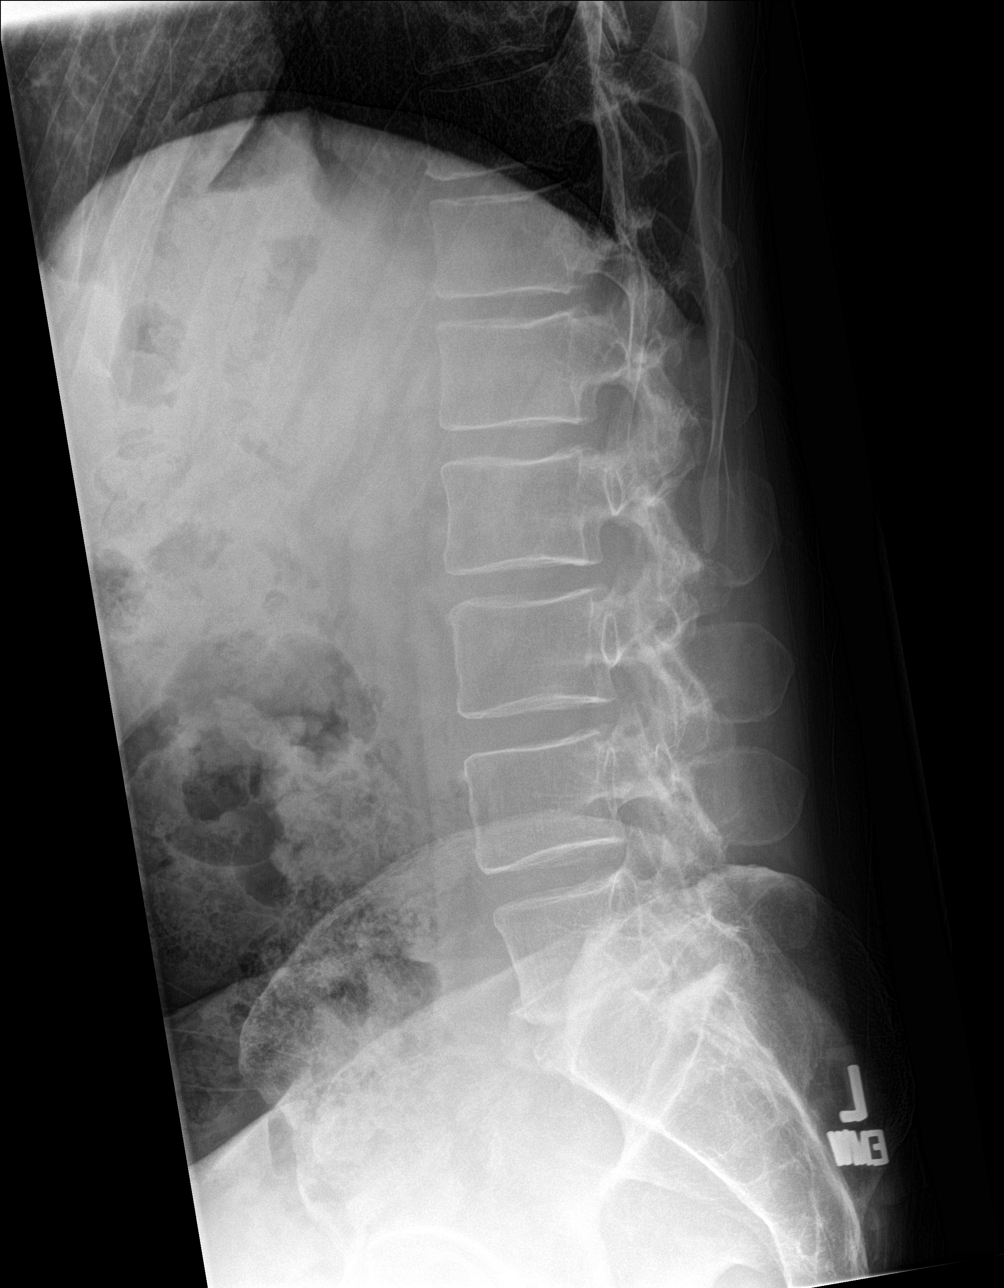

[l-spine spot]
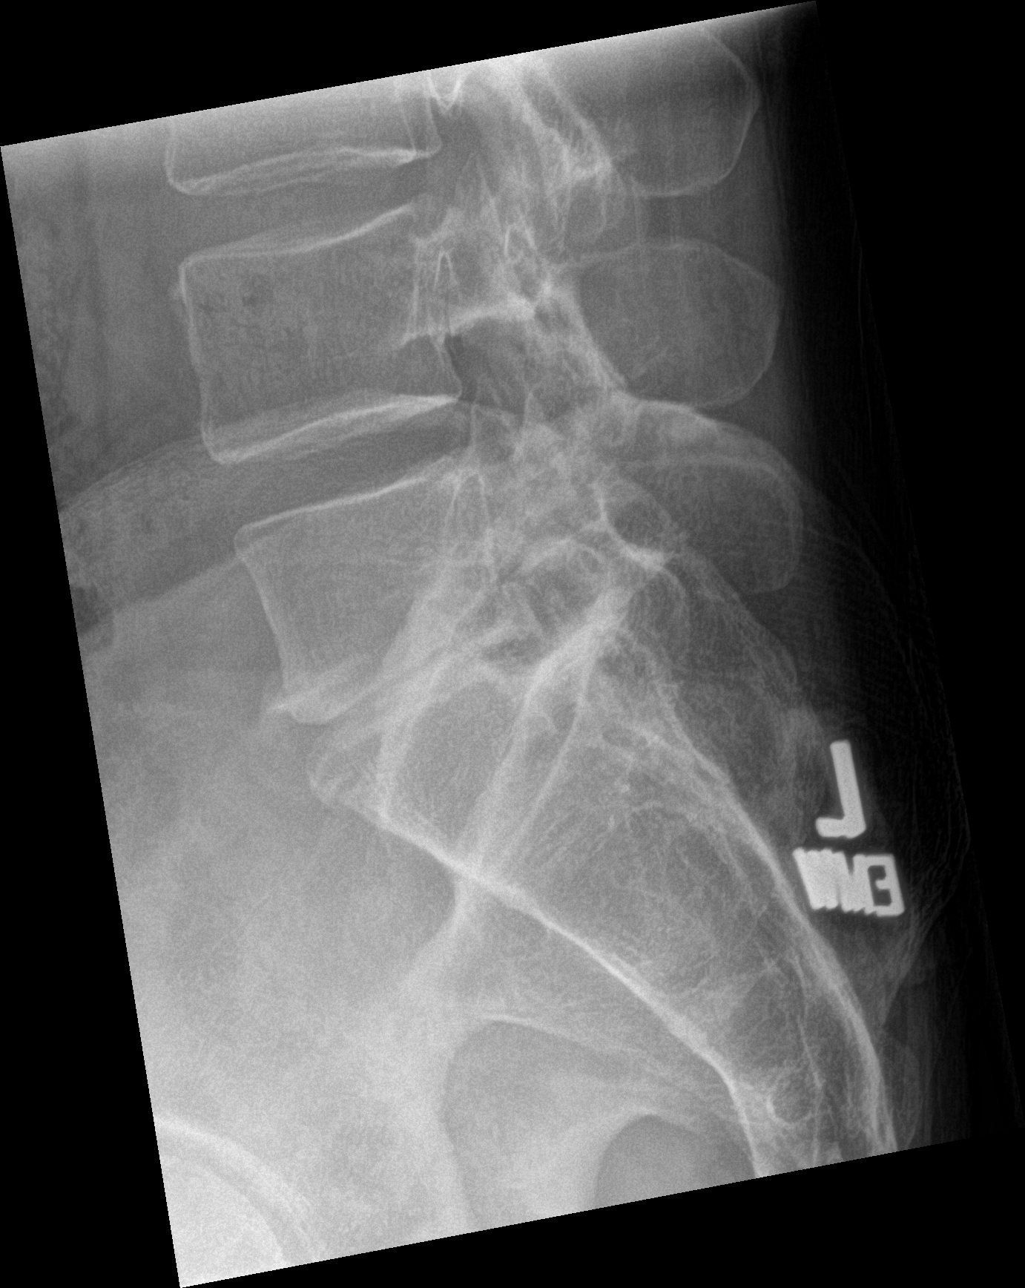

[5 of 5 positions shown; findings below may reference images not displayed]

FINDINGS: Frontal, lateral, spot lumbosacral lateral, and bilateral oblique
views were obtained. There are 5 non-rib-bearing lumbar type
vertebral bodies. There is no fracture or spondylolisthesis. There
is moderately severe narrowing at L5-S1. Other disc spaces appear
normal. There is facet osteoarthritic change at L5-S1 bilaterally.
IMPRESSION: Osteoarthritic change at L5-S1.  No fracture or spondylolisthesis.

## 2018-11-10 ENCOUNTER — Encounter: Payer: Self-pay | Admitting: Internal Medicine
